# Patient Record
Sex: Female | Born: 1955 | Race: Black or African American | Hispanic: No
Health system: Southern US, Community
[De-identification: ages and names within clinical notes are randomized; demographics above are authoritative.]

## PROBLEM LIST (undated history)

## (undated) ENCOUNTER — Emergency Department (HOSPITAL_COMMUNITY): Payer: Medicare HMO | Source: Home / Self Care

---

## 2020-11-11 ENCOUNTER — Emergency Department (HOSPITAL_COMMUNITY): Payer: Medicare HMO

## 2020-11-11 ENCOUNTER — Emergency Department (HOSPITAL_COMMUNITY)
Admission: EM | Admit: 2020-11-11 | Discharge: 2020-11-12 | Disposition: A | Discharge status: Home / Self Care | Payer: Medicare HMO | Attending: Emergency Medicine | Admitting: Emergency Medicine

## 2020-11-11 DIAGNOSIS — Z91040 Latex allergy status: Secondary | ICD-10-CM | POA: Insufficient documentation

## 2020-11-11 DIAGNOSIS — R0789 Other chest pain: Secondary | ICD-10-CM

## 2020-11-11 DIAGNOSIS — Z7982 Long term (current) use of aspirin: Secondary | ICD-10-CM | POA: Insufficient documentation

## 2020-11-11 DIAGNOSIS — R110 Nausea: Secondary | ICD-10-CM | POA: Diagnosis not present

## 2020-11-11 DIAGNOSIS — I77819 Aortic ectasia, unspecified site: Secondary | ICD-10-CM | POA: Insufficient documentation

## 2020-11-11 DIAGNOSIS — R072 Precordial pain: Secondary | ICD-10-CM | POA: Diagnosis present

## 2020-11-11 DIAGNOSIS — I77810 Thoracic aortic ectasia: Secondary | ICD-10-CM

## 2020-11-11 LAB — BASIC METABOLIC PANEL
Anion gap: 8 (ref 5–15)
BUN: 27 mg/dL — ABNORMAL HIGH (ref 8–23)
CO2: 21 mmol/L — ABNORMAL LOW (ref 22–32)
Calcium: 8.8 mg/dL — ABNORMAL LOW (ref 8.9–10.3)
Chloride: 110 mmol/L (ref 98–111)
Creatinine, Ser: 0.73 mg/dL (ref 0.44–1.00)
GFR, Estimated: >60 mL/min (ref >60)
Glucose, Bld: 105 mg/dL — ABNORMAL HIGH (ref 70–99)
Potassium: 3.3 mmol/L — ABNORMAL LOW (ref 3.5–5.1)
Sodium: 139 mmol/L (ref 135–145)

## 2020-11-11 LAB — CBC WITH DIFFERENTIAL/PLATELET
Abs Immature Granulocytes: 0.01 10*3/uL (ref 0.00–0.07)
Basophils Absolute: 0 10*3/uL (ref 0.0–0.1)
Basophils Relative: 0 %
Eosinophils Absolute: 0.2 10*3/uL (ref 0.0–0.5)
Eosinophils Relative: 5 %
HCT: 33.1 % — ABNORMAL LOW (ref 36.0–46.0)
Hemoglobin: 10.4 g/dL — ABNORMAL LOW (ref 12.0–15.0)
Immature Granulocytes: 0 %
Lymphocytes Relative: 45 %
Lymphs Abs: 1.7 10*3/uL (ref 0.7–4.0)
MCH: 27 pg (ref 26.0–34.0)
MCHC: 31.4 g/dL (ref 30.0–36.0)
MCV: 86 fL (ref 80.0–100.0)
Monocytes Absolute: 0.2 10*3/uL (ref 0.1–1.0)
Monocytes Relative: 6 %
Neutro Abs: 1.7 10*3/uL (ref 1.7–7.7)
Neutrophils Relative %: 44 %
Platelets: 123 10*3/uL — ABNORMAL LOW (ref 150–400)
RBC: 3.85 MIL/uL — ABNORMAL LOW (ref 3.87–5.11)
RDW: 15.7 % — ABNORMAL HIGH (ref 11.5–15.5)
WBC: 3.8 10*3/uL — ABNORMAL LOW (ref 4.0–10.5)
nRBC: 0 % (ref 0.0–0.2)

## 2020-11-11 LAB — TROPONIN I (HIGH SENSITIVITY): Troponin I (High Sensitivity): <2 ng/L (ref <18)

## 2020-11-11 LAB — D-DIMER, QUANTITATIVE: D-Dimer, Quant: 0.84 ug/mL-FEU — ABNORMAL HIGH (ref 0.00–0.50)

## 2020-11-11 NOTE — ED Provider Notes (Signed)
[facility] EMERGENCY DEPARTMENT Provider Note   CSN: [id number] Arrival date & time: 11/11/20  2135     History Chief Complaint  Patient presents with  . Chest Pain    Angelicia Szymborski is a 65 y.o. female.   Chest Pain Pain location:  Substernal area Pain quality: sharp and stabbing   Pain radiates to:  R jaw Pain severity:  Severe Onset quality:  Sudden Timing:  Constant Progression:  Resolved Chronicity:  New Relieved by:  Nitroglycerin and aspirin Worsened by:  Nothing Ineffective treatments:  None tried Associated symptoms: nausea   Associated symptoms: no back pain, no cough, no diaphoresis, no fever, no headache, no palpitations, no shortness of breath and no vomiting     HPI: A 65 year old patient with a history of hypercholesterolemia presents for evaluation of chest pain. Initial onset of pain was approximately 1-3 hours ago. The patient's chest pain is described as heaviness/pressure/tightness, is sharp and is not worse with exertion. The patient complains of nausea. The patient's chest pain is middle- or left-sided, is not well-localized and does radiate to the arms/jaw/neck. The patient denies diaphoresis. The patient has no history of stroke, has no history of peripheral artery disease, has not smoked in the past 90 days, denies any history of treated diabetes, has no relevant family history of coronary artery disease (first degree relative at less than age 70), is not hypertensive and does not have an elevated BMI (>=30).   No past medical history on file.  There are no problems to display for this patient.      OB History   No obstetric history on file.     No family history on file.     Home Medications Prior to Admission medications   Medication Sig Start Date End Date Taking? Authorizing Provider  alendronate (FOSAMAX) 70 MG tablet Take 1 tablet by mouth every Sunday. 11/11/20  Yes [provider]  ascorbic acid (VITAMIN  C) 250 MG tablet Take 500 mg by mouth daily. 07/22/18  Yes [provider]  aspirin EC 81 MG tablet Take 81 mg by mouth daily. Swallow whole.   Yes [provider]  atorvastatin (LIPITOR) 40 MG tablet Take 40 mg by mouth at bedtime. 11/02/20  Yes [provider]  BELSOMRA 10 MG TABS Take 10 mg by mouth at bedtime. 08/31/20  Yes [provider]  ciclopirox (PENLAC) 8 % solution Apply 1 application topically See admin instructions. Right foot big toe 11/10/20  Yes [provider]  citalopram (CELEXA) 20 MG tablet Take 20 mg by mouth daily. 11/02/20  Yes [provider]  cyclobenzaprine (FLEXERIL) 10 MG tablet Take 10 mg by mouth 3 (three) times daily as needed for muscle spasms. 11/02/20  Yes [provider]  ferrous sulfate 325 (65 FE) MG tablet Take 325 mg by mouth daily. 06/20/18  Yes [provider]  latanoprost (XALATAN) 0.005 % ophthalmic solution Place 1 drop into both eyes at bedtime. 10/28/20  Yes [provider]  LINZESS 72 MCG capsule Take 72 mcg by mouth every morning. 10/26/20  Yes [provider]  LORazepam (ATIVAN) 1 MG tablet Take 1 mg by mouth 2 (two) times daily as needed for anxiety.   Yes [provider]  meloxicam (MOBIC) 15 MG tablet Take 15 mg by mouth daily. 07/05/20  Yes [provider]  metoCLOPramide (REGLAN) 10 MG tablet Take 10 mg by mouth in the morning and at bedtime. 09/29/20  Yes [provider]  Multiple Vitamin (QUINTABS) TABS Take 1 tablet by mouth daily. 07/23/18  Yes [provider]  naproxen (NAPROSYN) 500 MG tablet Take 500 mg by mouth 2 (two) times daily as needed for mild pain. 10/18/20  Yes [provider]  omeprazole (PRILOSEC) 40 MG capsule Take 40 mg by mouth 2 (two) times daily. 08/18/20  Yes [provider]  oxyCODONE-acetaminophen (PERCOCET/ROXICET) 5-325 MG tablet Take 1 tablet by mouth every 6 (six) hours as needed for moderate  pain. 11/02/20  Yes [provider]  pregabalin (LYRICA) 200 MG capsule Take 200 mg by mouth in the morning and at bedtime. 06/20/18  Yes [provider]  topiramate (TOPAMAX) 50 MG tablet Take 50 mg by mouth at bedtime. 10/20/20  Yes [provider]  traZODone (DESYREL) 100 MG tablet Take 200 mg by mouth at bedtime. 10/18/20  Yes [provider]    Allergies    Latex, Other, Penicillin g, Gadopentetate dimeglumine, Sulfur, and Tizanidine  Review of Systems   Review of Systems  Constitutional: Negative for chills, diaphoresis and fever.  HENT: Negative for congestion and rhinorrhea.   Respiratory: Negative for cough and shortness of breath.   Cardiovascular: Positive for chest pain. Negative for palpitations.  Gastrointestinal: Positive for nausea. Negative for diarrhea and vomiting.  Genitourinary: Negative for difficulty urinating and dysuria.  Musculoskeletal: Negative for arthralgias and back pain.  Skin: Negative for rash and wound.  Neurological: Negative for light-headedness and headaches.    Physical Exam Updated Vital Signs BP 95/74   Pulse (!) 59   Temp 98 F (36.7 C)   Resp 17   SpO2 98%   Physical Exam Vitals and nursing note reviewed. Exam conducted with a chaperone present.  Constitutional:      General: She is not in acute distress.    Appearance: Normal appearance.  HENT:     Head: Normocephalic and atraumatic.     Nose: No rhinorrhea.  Eyes:     General:        Right eye: No discharge.        Left eye: No discharge.     Conjunctiva/sclera: Conjunctivae normal.  Cardiovascular:     Rate and Rhythm: Normal rate and regular rhythm.  Pulmonary:     Effort: Pulmonary effort is normal. No respiratory distress.     Breath sounds: No stridor.  Abdominal:     General: Abdomen is flat. There is no distension.     Palpations: Abdomen is soft.  Musculoskeletal:        General: No tenderness or signs of injury.  Skin:     General: Skin is warm and dry.  Neurological:     General: No focal deficit present.     Mental Status: She is alert. Mental status is at baseline.     Motor: No weakness.  Psychiatric:        Mood and Affect: Mood normal.        Behavior: Behavior normal.     ED Results / Procedures / Treatments   Labs (all labs ordered are listed, but only abnormal results are displayed) Labs Reviewed  CBC WITH DIFFERENTIAL/PLATELET - Abnormal; Notable for the following components:      Result Value   WBC 3.8 (*)    RBC 3.85 (*)    Hemoglobin 10.4 (*)    HCT 33.1 (*)    RDW 15.7 (*)    Platelets 123 (*)    All other components within  normal limits  D-DIMER, QUANTITATIVE - Abnormal; Notable for the following components:   D-Dimer, Quant 0.84 (*)    All other components within normal limits  BASIC METABOLIC PANEL - Abnormal; Notable for the following components:   Potassium 3.3 (*)    CO2 21 (*)    Glucose, Bld 105 (*)    BUN 27 (*)    Calcium 8.8 (*)    All other components within normal limits  TROPONIN I (HIGH SENSITIVITY)  TROPONIN I (HIGH SENSITIVITY)    EKG EKG Interpretation  Date/Time:  Friday November 11 2020 21:37:17 EDT Ventricular Rate:  75 PR Interval:  174 QRS Duration: 88 QT Interval:  414 QTC Calculation: 463 R Axis:   28 Text Interpretation: Sinus rhythm Low voltage, precordial leads Abnormal R-wave progression, early transition Nonspecific T abnormalities, anterior leads Confirmed by Cherlynn Perches ([id number]) on 11/11/2020 10:53:15 PM Also confirmed by Cherlynn Perches ([id number]), editor Markham, LaVerne [phone])  on 11/12/2020 9:15:37 AM   Radiology CT Angio Chest PE W and/or Wo Contrast  Result Date: 11/12/2020 CLINICAL DATA:  Sharp chest pain, elevated D-dimer EXAM: CT ANGIOGRAPHY CHEST WITH CONTRAST TECHNIQUE: Multidetector CT imaging of the chest was performed using the standard protocol during bolus administration of intravenous contrast. Multiplanar CT image reconstructions  and MIPs were obtained to evaluate the vascular anatomy. CONTRAST:  OMNIPAQUE IOHEXOL 350 MG/ML SOLN COMPARISON:  Radiograph 11/11/2020 FINDINGS: Cardiovascular: Satisfactory opacification the pulmonary arteries to the segmental level. No pulmonary artery filling defects are identified. Central pulmonary arteries are top-normal caliber. Foci of gas in the pulmonary trunk likely related to intravenous access. Normal heart size. No pericardial effusion. Scant coronary artery calcification borderline dilatation of the ascending thoracic aorta to 4 cm with the aortic arch measuring up to 3.2 cm in diameter as well. Distal descending thoracic aorta returns to a normal caliber of 2.7 cm at the level of the diaphragmatic hiatus. Mediastinum/Nodes: No mediastinal fluid or gas. Normal thyroid gland and thoracic inlet. No acute abnormality of the trachea or esophagus. No worrisome mediastinal, hilar or axillary adenopathy. Lungs/Pleura: Low volumes and atelectasis likely accentuated by imaging during exhalation for the angiographic technique. Additional bandlike areas of opacity in the lung bases likely atelectatic or scarring. Few scattered benign punctate granulomata are seen. Small sub solid nodule in the posterior right upper lobe measuring 5 mm in size (6/26). No other concerning pulmonary nodules or masses. Upper Abdomen: No acute abnormalities present in the visualized portions of the upper abdomen. Musculoskeletal: Multilevel degenerative changes are present in the imaged portions of the spine. Cervical spondylitic changes and anterolisthesis C7 on T1. Spinal nerve stimulator entering the canal at the T10-11 interspinous space terminating across the T7-T9 disc levels. Review of the MIP images confirms the above findings. IMPRESSION: 1. No evidence of acute pulmonary embolism. 2. 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 Recommend annual imaging followup by CTA or MRA. This recommendation  follows 2010 ACCF/AHA/AATS/ACR/ASA/SCA/SCAI/SIR/STS/SVM Guidelines for the Diagnosis and Management of Patients with Thoracic Aortic Disease. Circulation. 2010; 121: B716-R678. Aortic aneurysm NOS (ICD10-I71.9) 3. Low volumes and atelectasis likely accentuated by imaging during exhalation for the angiographic technique. 4. Small 5 mm sub solid nodule in the posterior right upper lobe. No follow-up recommended. This recommendation follows the consensus statement: Guidelines for Management of Incidental Pulmonary Nodules Detected on CT Images: From the Fleischner Society 2017; Radiology 2017; 284:228-243. 5. Aortic Atherosclerosis (ICD10-I70.0). Electronically Signed   By: Kreg Shropshire M.D.   On: 11/12/2020  04:54    Procedures Procedures   Medications Ordered in ED Medications  iohexol (OMNIPAQUE) 350 MG/ML injection 100 mL (100 mLs Intravenous Contrast Given 11/12/20 0418)    ED Course  I have reviewed the triage vital signs and the nursing notes.  Pertinent labs & imaging results that were available during my care of the patient were reviewed by me and considered in my medical decision making (see chart for details).    MDM Rules/Calculators/A&P HEAR Score: 5                        EKG performed, sinus rhythm no acute ischemic change interval abnormality or arrhythmia.  T waves.  Hear score 5.  Pain-free at this time.  No significant physical exam findings.  Patient has had this off and on.  It does not come on with exertion.  Happens randomly.  We will do cardiac biomarkers will get D-dimer.  Will get chest x-ray.  No new medications needed at this time.  Will reassess patient.  Pt care was handed off to on coming provider at 0000.  Complete history and physical and current plan have been communicated.  Please refer to their note for the remainder of ED care and ultimate disposition.  Pt seen in conjunction with Dr. Wilkie AyeHorton   Final Clinical Impression(s) / ED Diagnoses Final diagnoses:   Atypical chest pain  Ascending aorta dilatation [facility](HCC)    Rx / DC Orders ED Discharge Orders    None       Sabino DonovanKatz, Alpheus Stiff C, MD 11/13/20 2309

## 2020-11-11 NOTE — ED Triage Notes (Signed)
Pt arrives to ED BIB [facility] for Centralized CP that radiates to the RT side of jaw. 324mg  of Aspirin and 2 Nitro Sublingual with some relief. Pt a/o x4. EDP at bedside upon pt's arrival.  BP 130/90 HR 80

## 2020-11-11 NOTE — ED Notes (Signed)
Roddie Mc, daughter, [phone] would like call when she is in room so she can come back

## 2020-11-12 ENCOUNTER — Emergency Department (HOSPITAL_COMMUNITY): Payer: Medicare HMO

## 2020-11-12 LAB — TROPONIN I (HIGH SENSITIVITY): Troponin I (High Sensitivity): <2 ng/L (ref <18)

## 2020-11-12 MED ORDER — IOHEXOL 350 MG/ML SOLN
100.0000 mL | Freq: Once | INTRAVENOUS | Status: AC | PRN
  Administered 2020-11-12: 100 mL via INTRAVENOUS

## 2020-11-12 NOTE — Discharge Instructions (Addendum)
You are seen today for chest pain.  Your work-up is largely reassuring.  It was noted on your CT scan that your aorta in your chest is slightly dilated.  This is something that needs to be monitored with yearly imaging.  You were given referral for cardiothoracic surgery who can help you with monitoring this.  If you develop sudden onset ripping or tearing chest pain or new or worsening symptoms, you should be reevaluated

## 2020-11-12 NOTE — ED Provider Notes (Signed)
Patient signed out pending repeat troponin and CT to rule out blood clots.  Imaging finally obtained.  CT does not show any evidence of blood clot.  It does incidentally note some dilation of the ascending aorta and aortic arch.  No mention of dissection and patient's pain is not consistent with this.  However, I did advise her that she needs close surveillance and follow-up with cardiothoracic surgery for monitoring.  At this time her blood pressure is well controlled.  She was given information for follow-up.  Repeat troponin is negative.  After history, exam, and medical workup I feel the patient has been appropriately medically screened and is safe for discharge home. Pertinent diagnoses were discussed with the patient. Patient was given return precautions.  Results for orders placed or performed during the hospital encounter of 11/11/20  CBC with Differential  Result Value Ref Range   WBC 3.8 (L) 4.0 - 10.5 K/uL   RBC 3.85 (L) 3.87 - 5.11 MIL/uL   Hemoglobin 10.4 (L) 12.0 - 15.0 g/dL   HCT 72.5 (L) 36.6 - 44.0 %   MCV 86.0 80.0 - 100.0 fL   MCH 27.0 26.0 - 34.0 pg   MCHC 31.4 30.0 - 36.0 g/dL   RDW 34.7 (H) 42.5 - 95.6 %   Platelets 123 (L) 150 - 400 K/uL   nRBC 0.0 0.0 - 0.2 %   Neutrophils Relative % 44 %   Neutro Abs 1.7 1.7 - 7.7 K/uL   Lymphocytes Relative 45 %   Lymphs Abs 1.7 0.7 - 4.0 K/uL   Monocytes Relative 6 %   Monocytes Absolute 0.2 0.1 - 1.0 K/uL   Eosinophils Relative 5 %   Eosinophils Absolute 0.2 0.0 - 0.5 K/uL   Basophils Relative 0 %   Basophils Absolute 0.0 0.0 - 0.1 K/uL   Immature Granulocytes 0 %   Abs Immature Granulocytes 0.01 0.00 - 0.07 K/uL  D-dimer, quantitative  Result Value Ref Range   D-Dimer, Quant 0.84 (H) 0.00 - 0.50 ug/mL-FEU  Basic metabolic panel  Result Value Ref Range   Sodium 139 135 - 145 mmol/L   Potassium 3.3 (L) 3.5 - 5.1 mmol/L   Chloride 110 98 - 111 mmol/L   CO2 21 (L) 22 - 32 mmol/L   Glucose, Bld 105 (H) 70 - 99 mg/dL    BUN 27 (H) 8 - 23 mg/dL   Creatinine, Ser 3.87 0.44 - 1.00 mg/dL   Calcium 8.8 (L) 8.9 - 10.3 mg/dL   GFR, Estimated >56 >43 mL/min   Anion gap 8 5 - 15  Troponin I (High Sensitivity)  Result Value Ref Range   Troponin I (High Sensitivity) <2 <18 ng/L  Troponin I (High Sensitivity)  Result Value Ref Range   Troponin I (High Sensitivity) <2 <18 ng/L   DG Chest 2 View  Result Date: 11/11/2020 CLINICAL DATA:  Chest pain.  Radiates to right side of jaw. EXAM: CHEST - 2 VIEW COMPARISON:  CT cardiac 06/19/2018 FINDINGS: Cardiomegaly. Otherwise the heart size and mediastinal contours are within normal limits. Aortic arch calcification. Streaky bibasilar opacities consistent with atelectasis. No focal consolidation. No pulmonary edema. No pleural effusion. No pneumothorax. No acute osseous abnormality. Posterior central canal neural stimulator noted. IMPRESSION: Cardiomegaly with no active cardiopulmonary disease. Electronically Signed   By: Tish Frederickson M.D.   On: 11/11/2020 22:40   CT Angio Chest PE W and/or Wo Contrast  Result Date: 11/12/2020 CLINICAL DATA:  Sharp chest pain, elevated D-dimer EXAM: CT  ANGIOGRAPHY CHEST WITH CONTRAST TECHNIQUE: Multidetector CT imaging of the chest was performed using the standard protocol during bolus administration of intravenous contrast. Multiplanar CT image reconstructions and MIPs were obtained to evaluate the vascular anatomy. CONTRAST:  OMNIPAQUE IOHEXOL 350 MG/ML SOLN COMPARISON:  Radiograph 11/11/2020 FINDINGS: Cardiovascular: Satisfactory opacification the pulmonary arteries to the segmental level. No pulmonary artery filling defects are identified. Central pulmonary arteries are top-normal caliber. Foci of gas in the pulmonary trunk likely related to intravenous access. Normal heart size. No pericardial effusion. Scant coronary artery calcification borderline dilatation of the ascending thoracic aorta to 4 cm with the aortic arch measuring up to  3.2 cm in diameter as well. Distal descending thoracic aorta returns to a normal caliber of 2.7 cm at the level of the diaphragmatic hiatus. Mediastinum/Nodes: No mediastinal fluid or gas. Normal thyroid gland and thoracic inlet. No acute abnormality of the trachea or esophagus. No worrisome mediastinal, hilar or axillary adenopathy. Lungs/Pleura: Low volumes and atelectasis likely accentuated by imaging during exhalation for the angiographic technique. Additional bandlike areas of opacity in the lung bases likely atelectatic or scarring. Few scattered benign punctate granulomata are seen. Small sub solid nodule in the posterior right upper lobe measuring 5 mm in size (6/26). No other concerning pulmonary nodules or masses. Upper Abdomen: No acute abnormalities present in the visualized portions of the upper abdomen. Musculoskeletal: Multilevel degenerative changes are present in the imaged portions of the spine. Cervical spondylitic changes and anterolisthesis C7 on T1. Spinal nerve stimulator entering the canal at the T10-11 interspinous space terminating across the T7-T9 disc levels. Review of the MIP images confirms the above findings. IMPRESSION: 1. No evidence of acute pulmonary embolism. 2. 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 Recommend annual imaging followup by CTA or MRA. This recommendation follows 2010 ACCF/AHA/AATS/ACR/ASA/SCA/SCAI/SIR/STS/SVM Guidelines for the Diagnosis and Management of Patients with Thoracic Aortic Disease. Circulation. 2010; 121: I951-O841. Aortic aneurysm NOS (ICD10-I71.9) 3. Low volumes and atelectasis likely accentuated by imaging during exhalation for the angiographic technique. 4. Small 5 mm sub solid nodule in the posterior right upper lobe. No follow-up recommended. This recommendation follows the consensus statement: Guidelines for Management of Incidental Pulmonary Nodules Detected on CT Images: From the Fleischner Society 2017; Radiology 2017;  284:228-243. 5. Aortic Atherosclerosis (ICD10-I70.0). Electronically Signed   By: Kreg Shropshire M.D.   On: 11/12/2020 04:54      Physical Exam  BP 112/79 (BP Location: Right Arm)   Pulse (!) 58   Temp 97.8 F (36.6 C) (Oral)   Resp 19   SpO2 100%   Problem List Items Addressed This Visit   None   Visit Diagnoses    Atypical chest pain    -  Primary         Shon Baton, MD 11/12/20 [phone]

## 2021-12-11 IMAGING — CT CT ANGIO CHEST
2 of 7 series · 16 of 46 positions shown · IV contrast (APPLIED)
Comparison: Radiograph 11/11/2020

CLINICAL DATA: Sharp chest pain, elevated D-dimer

EXAM:
CT ANGIOGRAPHY CHEST WITH CONTRAST
TECHNIQUE: Multidetector CT imaging of the chest was performed using the
standard protocol during bolus administration of intravenous
contrast. Multiplanar CT image reconstructions and MIPs were
obtained to evaluate the vascular anatomy.
CONTRAST:  100mL OMNIPAQUE IOHEXOL 350 MG/ML SOLN

[Series 7: thins · axial · 0.75mm/px · z∈[-255,-34]mm · 13 of 356 slices shown]
[im 20/356  lung]
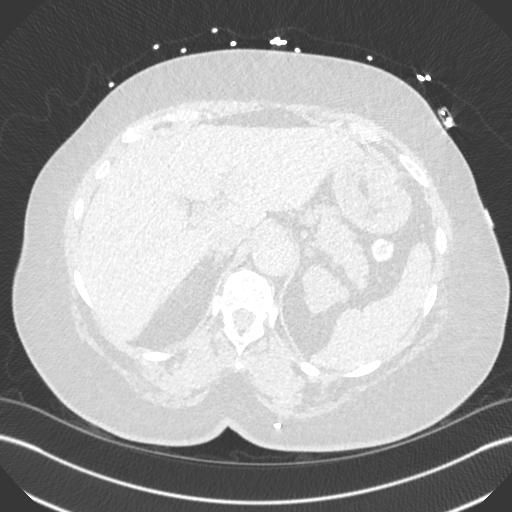
[im 40/356  soft-tissue]
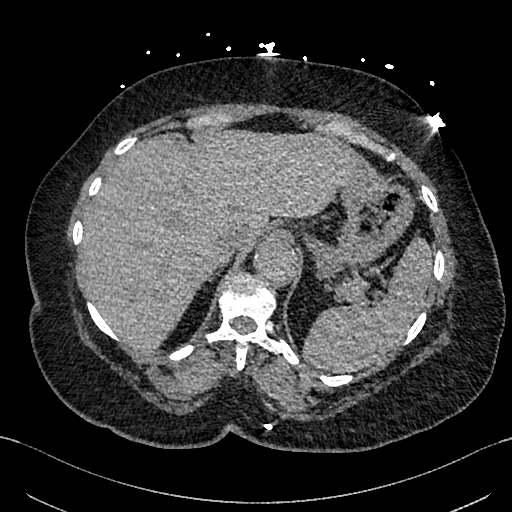
[im 79/356  lung]
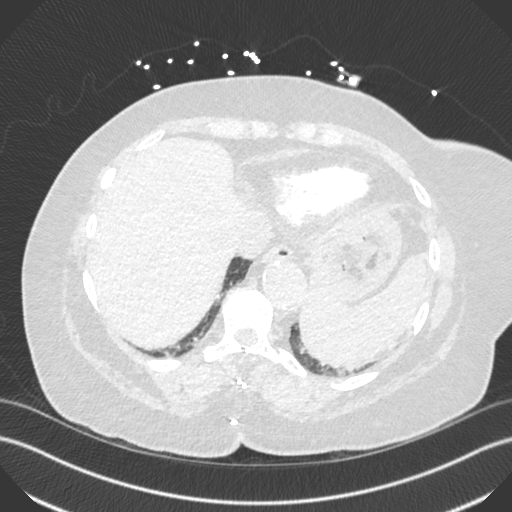
[im 99/356  soft-tissue]
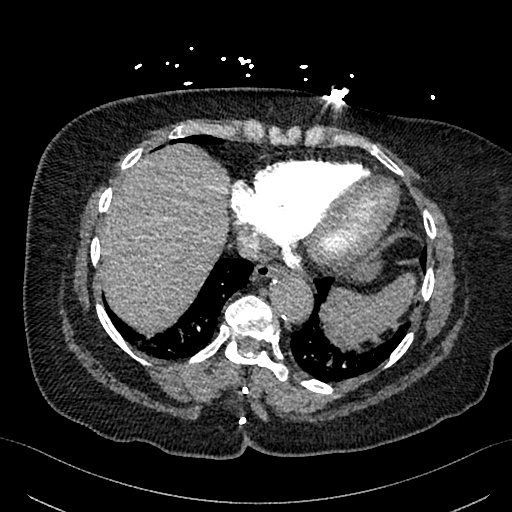
[im 119/356  lung]
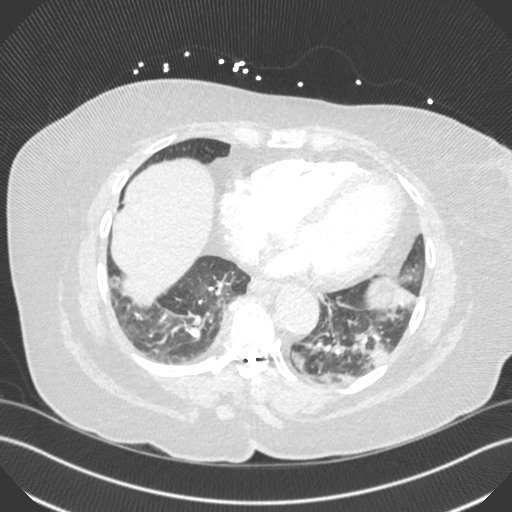
[im 158/356  soft-tissue]
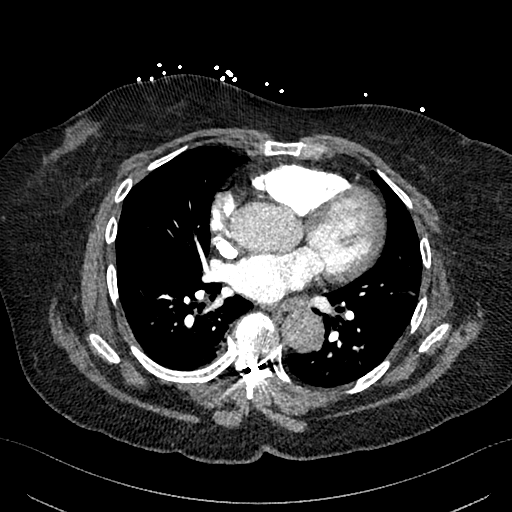
[im 178/356  lung]
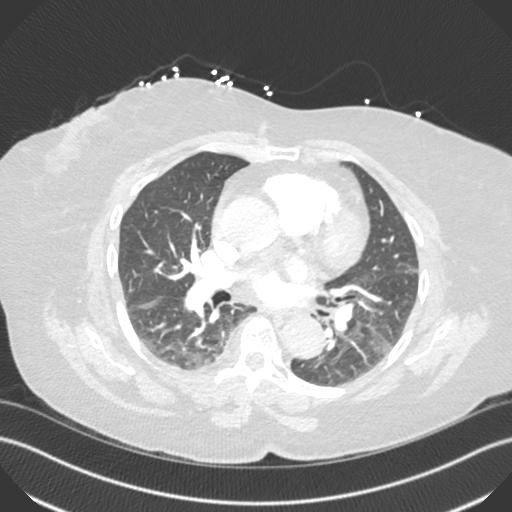
[im 198/356  soft-tissue]
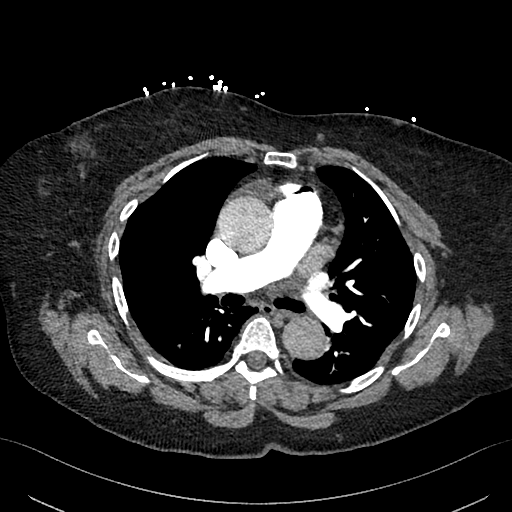
[im 237/356  lung]
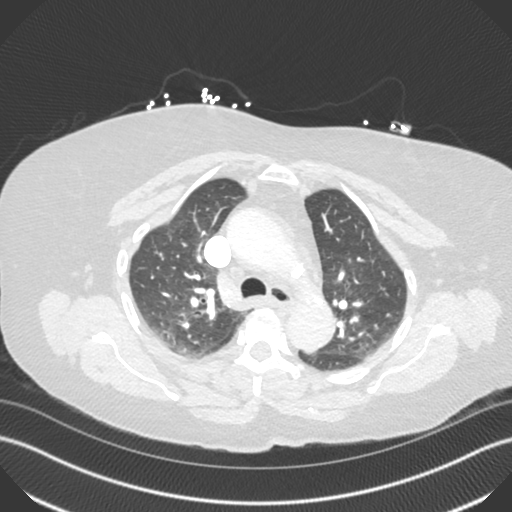
[im 257/356  soft-tissue]
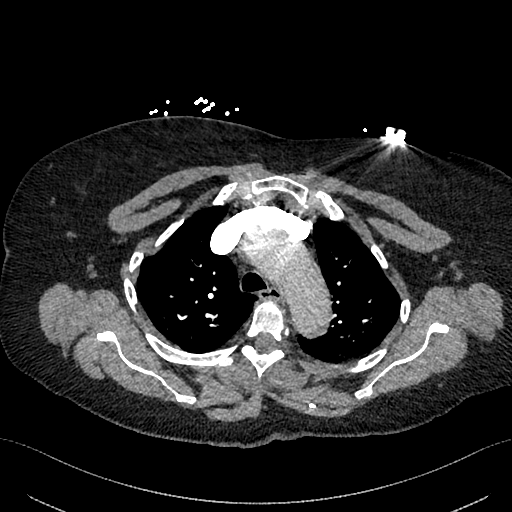
[im 277/356  lung]
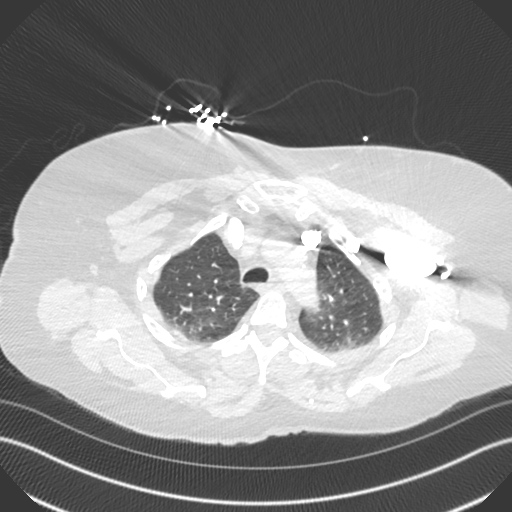
[im 316/356  soft-tissue]
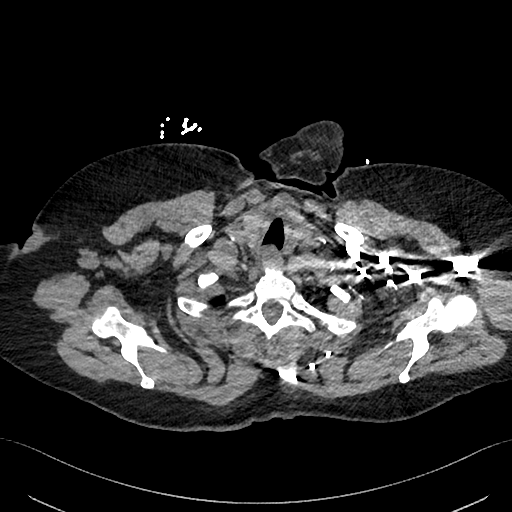
[im 336/356  lung]
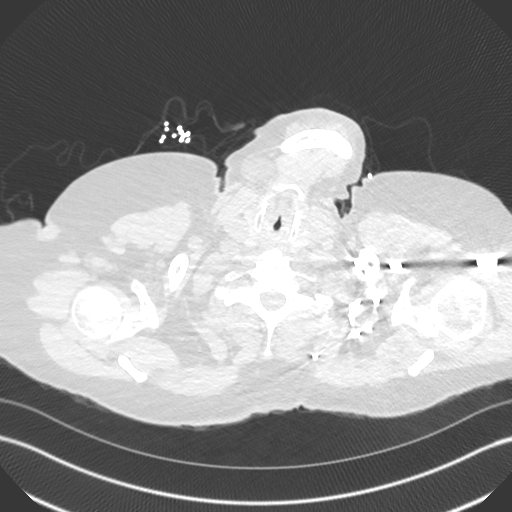

[Series 8: cor · coronal · 0.50mm/px · 3 of 157 slices shown]
[im 40/157  soft-tissue]
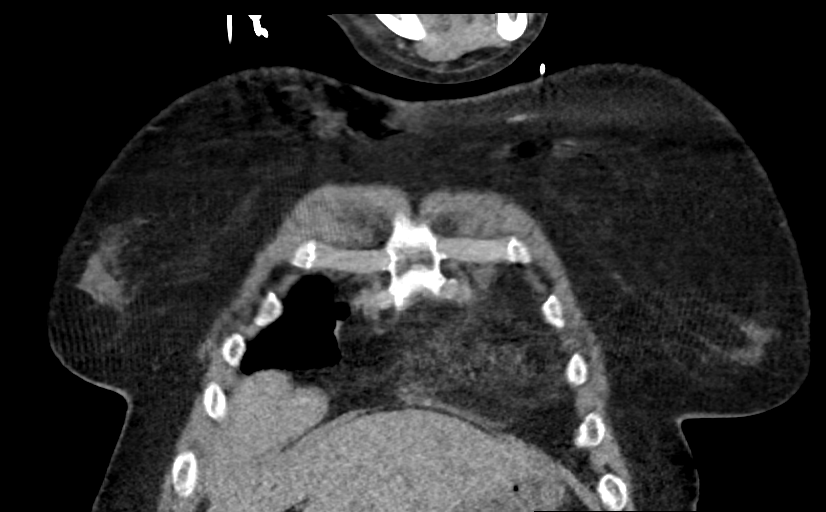
[im 79/157  soft-tissue]
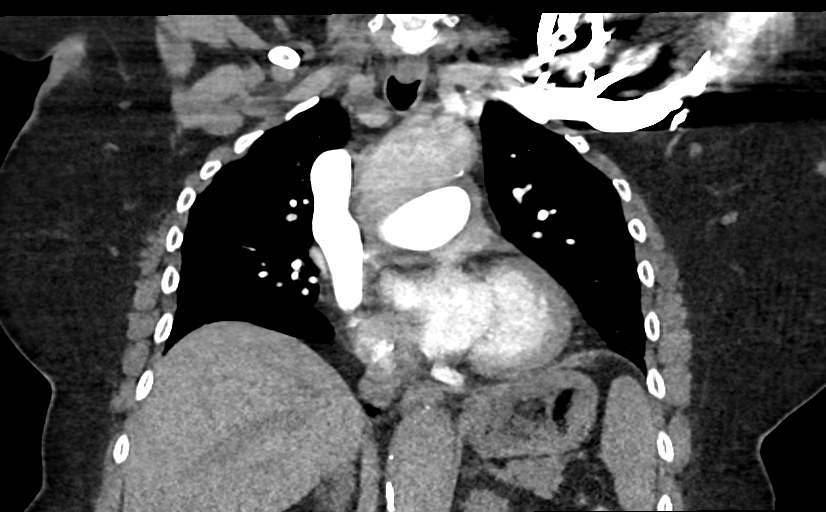
[im 118/157  soft-tissue]
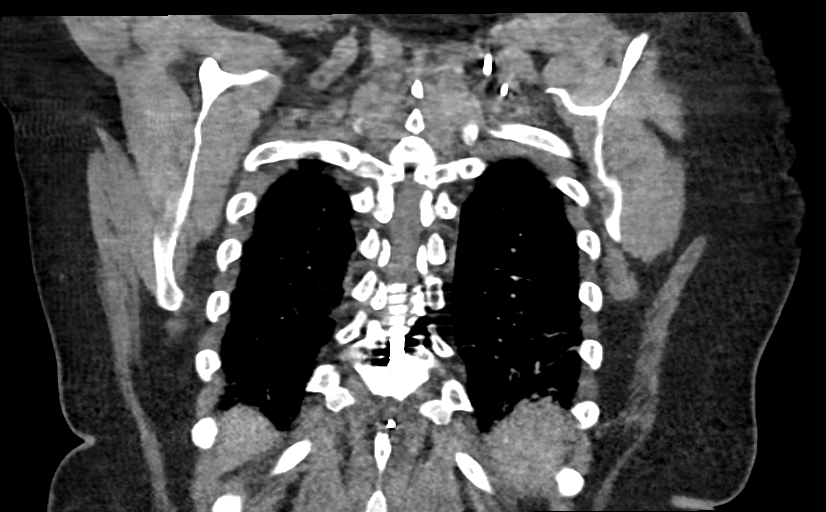

[16 of 46 positions shown; findings below may reference images not displayed]

FINDINGS: Cardiovascular: Satisfactory opacification the pulmonary arteries to
the segmental level. No pulmonary artery filling defects are
identified. Central pulmonary arteries are top-normal caliber. Foci
of gas in the pulmonary trunk likely related to intravenous access.
Normal heart size. No pericardial effusion. Scant coronary artery
calcification borderline dilatation of the ascending thoracic aorta
to 4 cm with the aortic arch measuring up to 3.2 cm in diameter as
well. Distal descending thoracic aorta returns to a normal caliber
of 2.7 cm at the level of the diaphragmatic hiatus.

Mediastinum/Nodes: No mediastinal fluid or gas. Normal thyroid gland
and thoracic inlet. No acute abnormality of the trachea or
esophagus. No worrisome mediastinal, hilar or axillary adenopathy.

Lungs/Pleura: Low volumes and atelectasis likely accentuated by
imaging during exhalation for the angiographic technique. Additional
bandlike areas of opacity in the lung bases likely atelectatic or
scarring. Few scattered benign punctate granulomata are seen. Small
sub solid nodule in the posterior right upper lobe measuring 5 mm in
size ([DATE]). No other concerning pulmonary nodules or masses.

Upper Abdomen: No acute abnormalities present in the visualized
portions of the upper abdomen.

Musculoskeletal: Multilevel degenerative changes are present in the
imaged portions of the spine. Cervical spondylitic changes and
anterolisthesis C7 on T1. Spinal nerve stimulator entering the canal
at the T10-11 interspinous space terminating across the T7-T9 disc
levels.

Review of the MIP images confirms the above findings.
IMPRESSION: 1. No evidence of acute pulmonary embolism.
2. Dilatation of the ascending thoracic aorta and aortic arch.
Recommend annual imaging followup by CTA or MRA. This recommendation
follows 2424 ACCF/AHA/AATS/ACR/ASA/SCA/ROSSY/SAKURA/ALAIN NOEL/SENDER Guidelines
for the Diagnosis and Management of Patients with Thoracic Aortic
Disease. Circulation. 2424; 121: E266-e369. Aortic aneurysm NOS
([id number].O)
3. Low volumes and atelectasis likely accentuated by imaging during
exhalation for the angiographic technique.
4. Small 5 mm sub solid nodule in the posterior right upper lobe. No
follow-up recommended. This recommendation follows the consensus
statement: Guidelines for Management of Incidental Pulmonary Nodules
Detected on CT Images: From the [HOSPITAL] 0340; Radiology
5. Aortic Atherosclerosis ([id number].T).
